# Patient Record
Sex: Female | Born: 1935 | Race: White | Hispanic: No | Marital: Married | Smoking: Never smoker
Health system: Southern US, Community
[De-identification: ages and names within clinical notes are randomized; demographics above are authoritative.]

## PROBLEM LIST (undated history)

## (undated) DIAGNOSIS — C801 Malignant (primary) neoplasm, unspecified: Secondary | ICD-10-CM

## (undated) DIAGNOSIS — I10 Essential (primary) hypertension: Secondary | ICD-10-CM

## (undated) DIAGNOSIS — M810 Age-related osteoporosis without current pathological fracture: Secondary | ICD-10-CM

## (undated) HISTORY — PX: ANTERIOR AND POSTERIOR VAGINAL REPAIR: SUR5

## (undated) HISTORY — PX: ABDOMINAL HYSTERECTOMY: SHX81

## (undated) HISTORY — PX: APPENDECTOMY: SHX54

## (undated) HISTORY — PX: TONSILLECTOMY: SUR1361

---

## 2012-07-08 ENCOUNTER — Other Ambulatory Visit: Payer: Self-pay | Admitting: Rheumatology

## 2012-07-08 LAB — SYNOVIAL CELL COUNT + DIFF, W/ CRYSTALS
Basophil: 0 %
Eosinophil: 0 %
Lymphocytes: 32 %
Other Cells BF: 0 %
Other Mononuclear Cells: 62 %

## 2012-10-23 ENCOUNTER — Ambulatory Visit: Payer: Self-pay | Admitting: Nurse Practitioner

## 2013-01-19 ENCOUNTER — Ambulatory Visit: Payer: Self-pay | Admitting: Ophthalmology

## 2013-01-19 DIAGNOSIS — Z0000 Encounter for general adult medical examination without abnormal findings: Secondary | ICD-10-CM

## 2013-01-26 ENCOUNTER — Ambulatory Visit: Payer: Self-pay | Admitting: Ophthalmology

## 2013-12-02 ENCOUNTER — Ambulatory Visit: Payer: Self-pay | Admitting: Nurse Practitioner

## 2014-08-20 NOTE — Op Note (Signed)
PATIENT NAME:  Monica Holder, Monica Holder MR#:  [id number] DATE OF BIRTH:  05-27-1930  DATE OF PROCEDURE:  01/26/2013  PREOPERATIVE DIAGNOSIS:  Cataract, right eye.   POSTOPERATIVE DIAGNOSIS:  Cataract, right eye.  PROCEDURE PERFORMED:  Extracapsular cataract extraction using phacoemulsification with placement of an Alcon [id number], 24.0-diopter posterior chamber lens, serial [id number].  SURGEON:  Loura Back. Whitman Meinhardt, MD  ASSISTANT:  None.  ANESTHESIA:  4% lidocaine and 0.75% Marcaine in a 50/50 mixture with 10 units/mL of Hylenex added, given as a peribulbar.  ANESTHESIOLOGIST:  Velora Heckler. Polin, MD  COMPLICATIONS:  None.  ESTIMATED BLOOD LOSS:  Less than 1 ml.  DESCRIPTION OF PROCEDURE:  The patient was brought to the operating room and given a peribulbar block.  The patient was then prepped and draped in the usual fashion.  The vertical rectus muscles were imbricated using 5-0 silk sutures.  These sutures were then clamped to the sterile drapes as bridle sutures.  A limbal peritomy was performed extending two clock hours and hemostasis was obtained with cautery.  A partial thickness scleral groove was made at the surgical limbus and dissected anteriorly in a lamellar dissection using an Alcon crescent knife.  The anterior chamber was entered superonasally with a Superblade and through the lamellar dissection with a 2.6 mm keratome.  DisCoVisc was used to replace the aqueous and a continuous tear capsulorrhexis was carried out.  Hydrodissection and hydrodelineation were carried out with balanced salt and a 27 gauge canula.  The nucleus was rotated to confirm the effectiveness of the hydrodissection.  Phacoemulsification was carried out using a divide-and-conquer technique.  Total ultrasound time was 1 minute and 26 seconds with an average power of 24.7, CDE of 35.91.   Irrigation/aspiration was used to remove the residual cortex.  DisCoVisc was used to inflate the capsule and the internal incision  was enlarged to 3 mm with the crescent knife.  The intraocular lens was folded and inserted into the capsular bag using the AcrySert delivery system.  Irrigation/aspiration was used to remove the residual DisCoVisc.  Miostat was injected into the anterior chamber through the paracentesis track to inflate the anterior chamber and induce miosis. 0.1 mL of cefuroxime delivering 1 mg of drug was placed in the anterior chamber via the paracentesis track. The wound was checked for leaks and none were found. The conjunctiva was closed with cautery and the bridle sutures were removed.  Two drops of 0.3% Vigamox were placed on the eye.   An eye shield was placed on the eye.  The patient was discharged to the recovery room in good condition.   ____________________________ Loura Back Jaion Lagrange, MD sad:jm D: 01/26/2013 13:20:16 ET T: 01/26/2013 14:44:58 ET JOB#: [id number]  cc: Remo Lipps A. Bladyn Tipps, MD, <Dictator> Martie Lee MD ELECTRONICALLY SIGNED 02/02/2013 13:24

## 2016-02-16 ENCOUNTER — Other Ambulatory Visit: Payer: Self-pay | Admitting: Nurse Practitioner

## 2016-02-16 DIAGNOSIS — Z1231 Encounter for screening mammogram for malignant neoplasm of breast: Secondary | ICD-10-CM

## 2016-02-22 ENCOUNTER — Other Ambulatory Visit: Payer: Self-pay | Admitting: Nurse Practitioner

## 2016-02-22 ENCOUNTER — Ambulatory Visit
Admission: RE | Admit: 2016-02-22 | Discharge: 2016-02-22 | Disposition: A | Discharge status: Home / Self Care | Payer: Medicare Other | Source: Ambulatory Visit | Attending: Nurse Practitioner | Admitting: Nurse Practitioner

## 2016-02-22 DIAGNOSIS — Z1231 Encounter for screening mammogram for malignant neoplasm of breast: Secondary | ICD-10-CM | POA: Insufficient documentation

## 2016-02-22 HISTORY — DX: Malignant (primary) neoplasm, unspecified: C80.1

## 2016-04-12 ENCOUNTER — Ambulatory Visit
Admission: EM | Admit: 2016-04-12 | Discharge: 2016-04-12 | Disposition: A | Discharge status: Home / Self Care | Payer: Medicare Other | Attending: Emergency Medicine | Admitting: Emergency Medicine

## 2016-04-12 ENCOUNTER — Encounter: Payer: Self-pay | Admitting: *Deleted

## 2016-04-12 DIAGNOSIS — J069 Acute upper respiratory infection, unspecified: Secondary | ICD-10-CM | POA: Diagnosis not present

## 2016-04-12 HISTORY — DX: Essential (primary) hypertension: I10

## 2016-04-12 HISTORY — DX: Age-related osteoporosis without current pathological fracture: M81.0

## 2016-04-12 MED ORDER — MOMETASONE FUROATE 50 MCG/ACT NA SUSP: 2.0000 | Freq: Every day | NASAL | 0 refills | Status: AC

## 2016-04-12 MED ORDER — BENZONATATE 200 MG PO CAPS: 200.0000 mg | ORAL_CAPSULE | Freq: Three times a day (TID) | ORAL | 0 refills | Status: AC | PRN

## 2016-04-12 MED ORDER — AMOXICILLIN-POT CLAVULANATE 875-125 MG PO TABS: 1.0000 | ORAL_TABLET | Freq: Two times a day (BID) | ORAL | 0 refills | Status: AC

## 2016-04-12 NOTE — Discharge Instructions (Signed)
Take the medication as written. Finish the  antibiotics. You may take 1 gram of tylenol up to 3 times a day as needed for pain.  Use a neti pot or the NeilMed sinus rinse as often as you want to to reduce nasal congestion. Follow the directions on the box.   Go to www.goodrx.com to look up your medications. This will give you a list of where you can find your prescriptions at the most affordable prices.

## 2016-04-12 NOTE — ED Triage Notes (Addendum)
Patient started having sore throat, nasal congestion, and cough 6 days ago. PAtient reports taking some left over amoxicillin that she had froze when she didn't finish the course 2 years ago. Patient reports improvement, but she ran out of amox.

## 2016-04-12 NOTE — ED Provider Notes (Signed)
HPI  SUBJECTIVE:  Monica Holder is a 82 y.o. female who presents with cough productive of clear phlegm, chest soreness, nasal congestion, rhinorrhea, postnasal drip, scratchy, sore throat, body aches, malaise, sinus pain and pressure for the past 6 days. She has been taking amoxicillin 500 mg twice a day for the past 6 days and Sudafed. She has run out of amoxicillin. She states that the amoxicillin and Sudafed help her symptoms. Symptoms are worse with going out into the cold air. She denies ear pain, she is sleeping okay at night. No dental pain, purulent nasal drainage. No antipyretic in the past 6-8 hours. She has a past medical history of acid process, osteoarthritis, no history of diabetes, hypertension, asthma, emphysema, COPD. She does not smoke. PMD: Sallee Lange, NP    Past Medical History:  Diagnosis Date  . Cancer ([place])    cervical ca  . Hypertension   . Osteoporosis     Past Surgical History:  Procedure Laterality Date  . ABDOMINAL HYSTERECTOMY    . ANTERIOR AND POSTERIOR VAGINAL REPAIR    . APPENDECTOMY    . TONSILLECTOMY      Family History  Problem Relation Age of Onset  . Breast cancer Mother 90    Social History  Substance Use Topics  . Smoking status: Never Smoker  . Smokeless tobacco: Never Used  . Alcohol use No    No current facility-administered medications for this encounter.   Current Outpatient Prescriptions:  .  amLODipine (NORVASC) 5 MG tablet, Take 5 mg by mouth daily., Disp: , Rfl:  .  denosumab (PROLIA) 60 MG/ML SOLN injection, Inject 60 mg into the skin every 6 (six) months. Administer in upper arm, thigh, or abdomen, Disp: , Rfl:   Allergies  Allergen Reactions  . Coconut Oil Other (See Comments)    Fever blisters on lips.     ROS  As noted in HPI.   Physical Exam  BP (!) 155/75 (BP Location: Right Arm)   Pulse 100   Temp 98 F (36.7 C) (Oral)   Resp 16   Ht 5\' 1"  (1.549 m)   Wt 128 lb (58.1 kg)   SpO2 98%   BMI  24.19 kg/m   Constitutional: Well developed, well nourished, no acute distress Eyes:  EOMI, conjunctiva normal bilaterally HENT: Normocephalic, atraumatic,mucus membranes moist. Positive mucopurulent nasal congestion, minimal sinus tenderness. Normal oropharynx with cobblestoning and postnasal drip  Respiratory: Normal inspiratory effort lungs clear bilaterally, good air movement Cardiovascular: Normal rate regular rhythm no murmurs rubs or gallops GI: nondistended skin: No rash, skin intact Musculoskeletal: no deformities Neurologic: Alert & oriented x 3, no focal neuro deficits Psychiatric: Speech and behavior appropriate   ED Course   Medications - No data to display  No orders of the defined types were placed in this encounter.   No results found for this or any previous visit (from the past 24 hour(s)). No results found.  ED Clinical Impression  Upper respiratory tract infection, unspecified type   ED Assessment/Plan  Presentation most consistent with a URI/early sinusitis, patient has partially treated herself of the amoxicillin. We'll send home with Augmentin for 5 days, Nasonex, Tessalon. She is to continue Sudafed, neti pot, Tylenol.  she'll follow-up with her primary care physician as needed. Discussed signs and symptoms that should prompt return to the emergency department. Patient agrees with plan  Meds ordered this encounter  Medications  . denosumab (PROLIA) 60 MG/ML SOLN injection    Sig:  Inject 60 mg into the skin every 6 (six) months. Administer in upper arm, thigh, or abdomen  . amLODipine (NORVASC) 5 MG tablet    Sig: Take 5 mg by mouth daily.    *This clinic note was created using Dragon dictation software. Therefore, there may be occasional mistakes despite careful proofreading.  ?   Melynda Ripple, MD 04/12/16 [phone]

## 2019-03-17 ENCOUNTER — Other Ambulatory Visit: Payer: Self-pay | Admitting: Neurology

## 2019-03-17 DIAGNOSIS — R413 Other amnesia: Secondary | ICD-10-CM

## 2019-03-29 ENCOUNTER — Ambulatory Visit
Admission: RE | Admit: 2019-03-29 | Discharge: 2019-03-29 | Disposition: A | Discharge status: Home / Self Care | Payer: Medicare Other | Source: Ambulatory Visit | Attending: Neurology | Admitting: Neurology

## 2019-03-29 ENCOUNTER — Other Ambulatory Visit: Payer: Self-pay

## 2019-03-29 DIAGNOSIS — R413 Other amnesia: Secondary | ICD-10-CM | POA: Diagnosis not present

## 2020-02-17 IMAGING — MR MR HEAD W/O CM
12 series · 43 of 48 positions shown · non-contrast
Comparison: None.

CLINICAL DATA: Memory loss/increasing forgetfulness.

EXAM:
MRI HEAD WITHOUT CONTRAST
TECHNIQUE: Multiplanar, multiecho pulse sequences of the brain and surrounding
structures were obtained without intravenous contrast.

[Series 5: ax dwi_tracew · axial · 3.0mm · 0.60mm/px · z∈[-43,+104]mm · 5 of 48 slices shown]
[im 1/48]
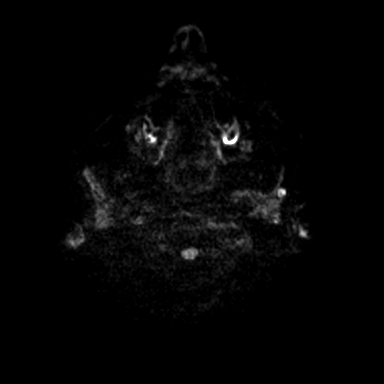
[im 12/48]
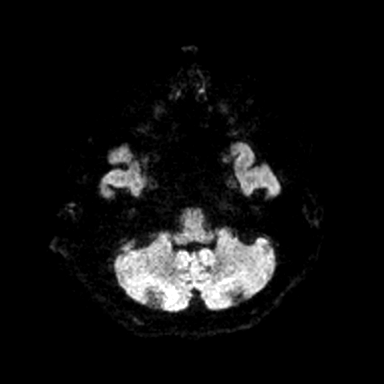
[im 24/48]
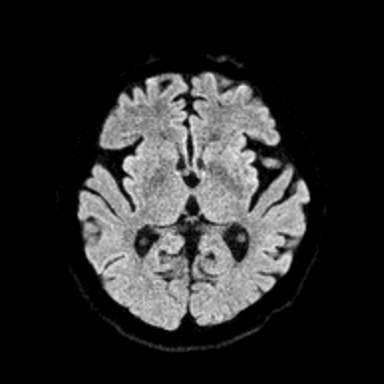
[im 36/48]
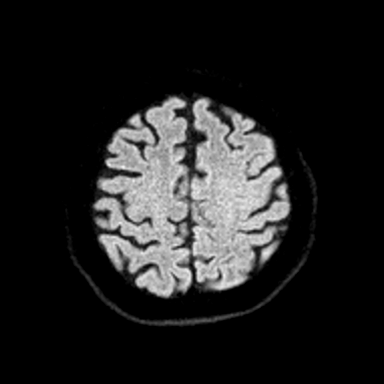
[im 48/48]
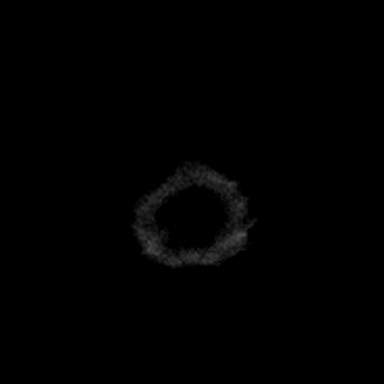

[Series 6: ax dwi_adc · axial · 3.0mm · 0.60mm/px · z∈[-43,+104]mm · 4 of 48 slices shown]
[im 1/48]
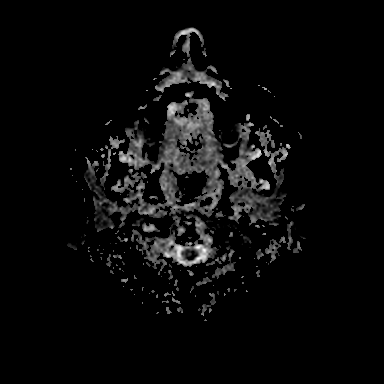
[im 16/48]
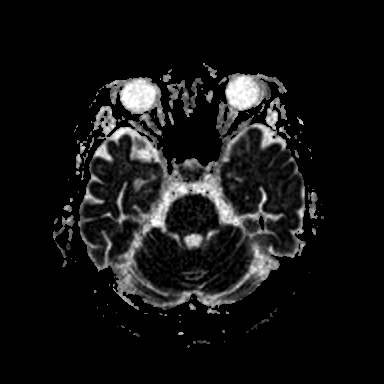
[im 32/48]
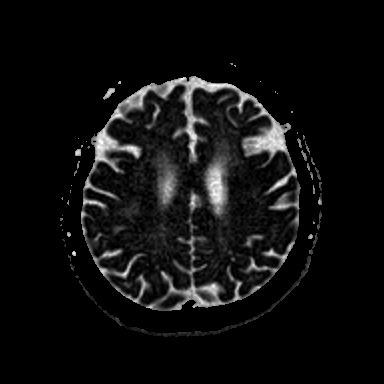
[im 48/48]
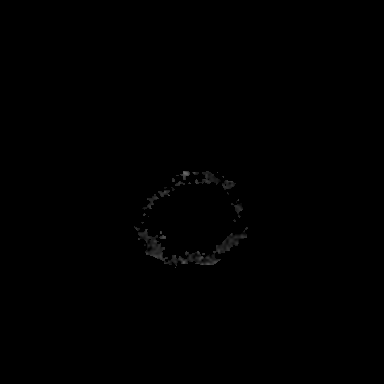

[Series 7: T1 · sagittal · 5.0mm · 0.60mm/px · 2 of 24 slices shown (1 of 2)]
[im 1/24]
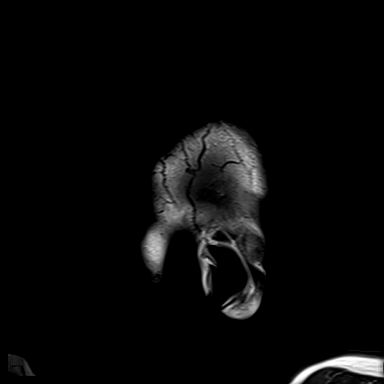
[im 24/24]
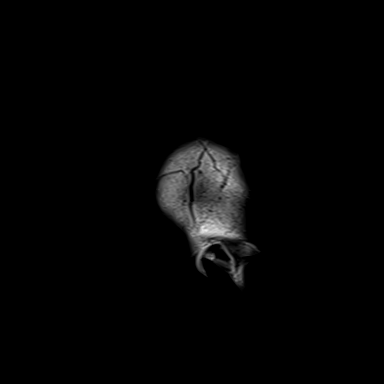

[Series 8: cor dwi_tracew · coronal · 5.0mm · 0.60mm/px · 3 of 38 slices shown]
[im 1/38]
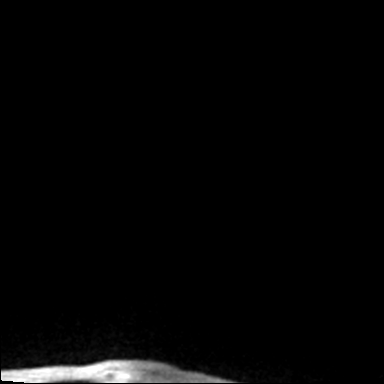
[im 19/38]
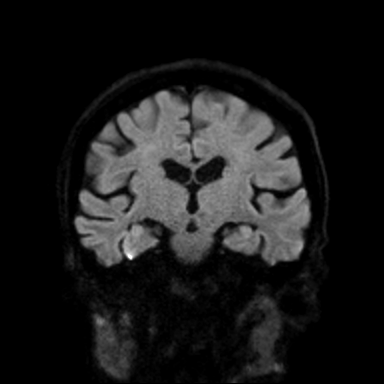
[im 38/38]
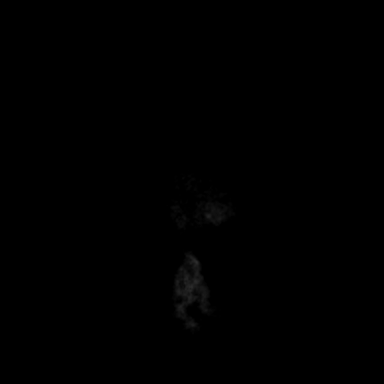

[Series 9: cor dwi_adc · coronal · 5.0mm · 0.60mm/px · 3 of 37 slices shown]
[im 1/37]
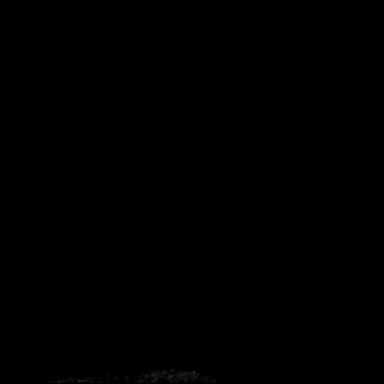
[im 19/37]
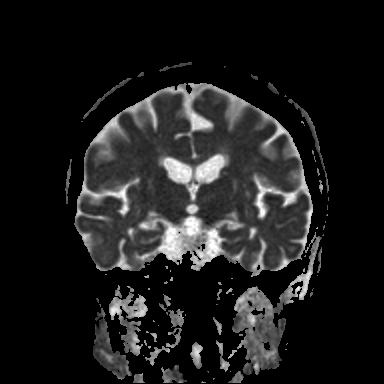
[im 37/37]
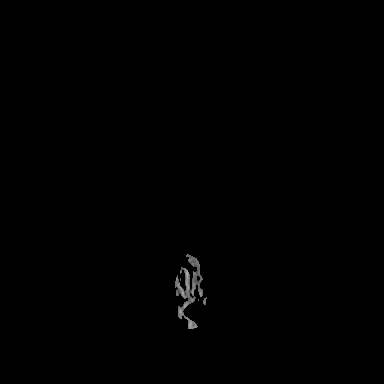

[Series 10: T2 · axial · 5.0mm · 0.53mm/px · z∈[-38,+99]mm · 2 of 25 slices shown (1 of 2)]
[im 1/25]
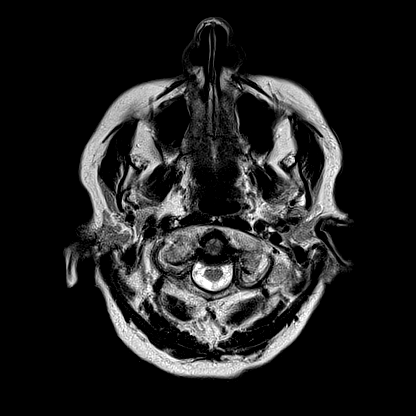
[im 25/25]
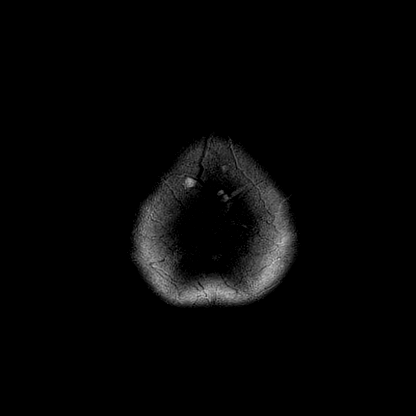

[Series 11: mag_images · axial · 4.0mm · 0.90mm/px · z∈[-42,+106]mm · 3 of 40 slices shown]
[im 1/40]
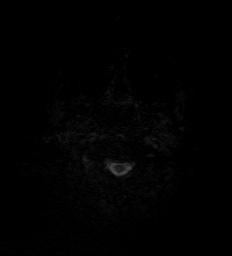
[im 20/40]
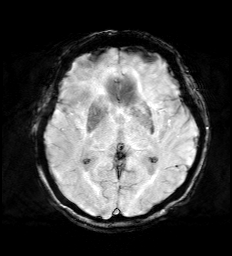
[im 40/40]
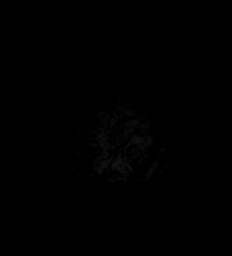

[Series 12: pha_images · axial · 4.0mm · 0.90mm/px · z∈[-42,+106]mm · 3 of 40 slices shown]
[im 1/40]
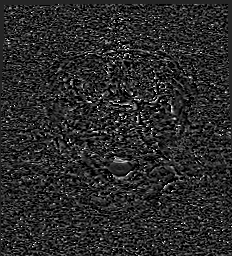
[im 20/40]
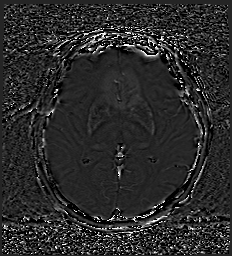
[im 40/40]
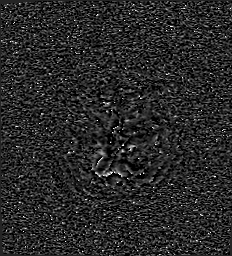

[Series 13: swi_images · axial · 4.0mm · 0.90mm/px · z∈[-42,+106]mm · 3 of 40 slices shown]
[im 1/40]
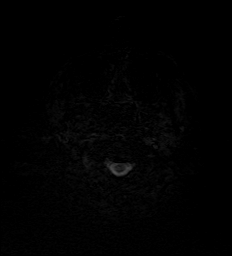
[im 20/40]
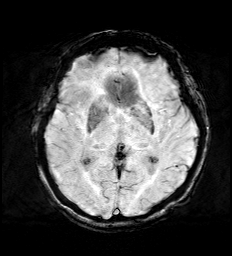
[im 40/40]
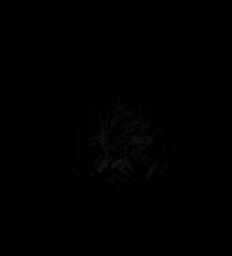

[Series 15: FLAIR · axial · 3.0mm · 0.53mm/px · z∈[-47,+107]mm · 4 of 55 slices shown]
[im 1/55]
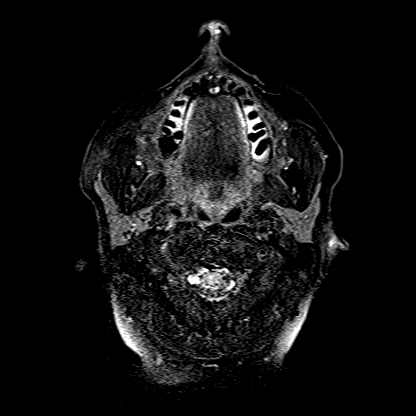
[im 19/55]
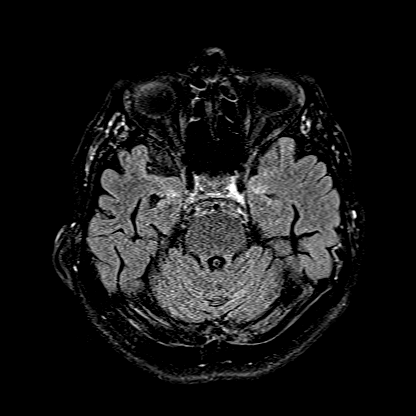
[im 37/55]
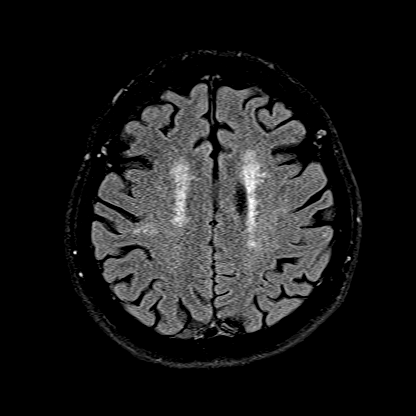
[im 55/55]
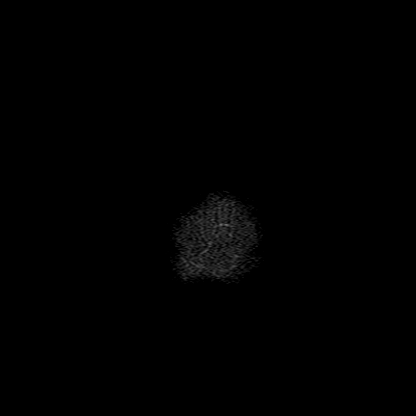

[Series 16: T1 · axial · 1.0mm · 0.98mm/px · z∈[-52,+116]mm · 9 of 176 slices shown (2 of 2)]
[im 1/176]
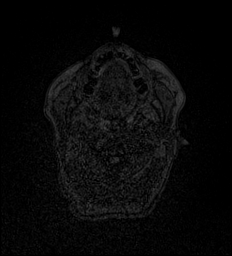
[im 14/176]
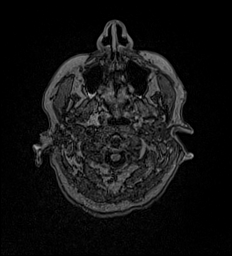
[im 27/176]
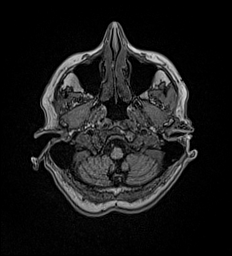
[im 54/176]
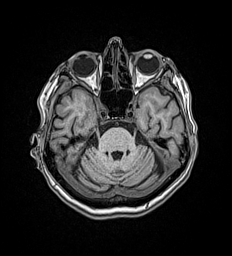
[im 81/176]
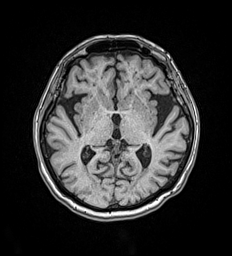
[im 95/176]
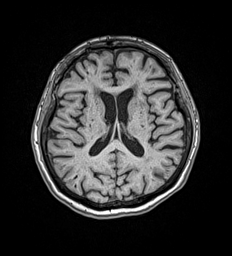
[im 122/176]
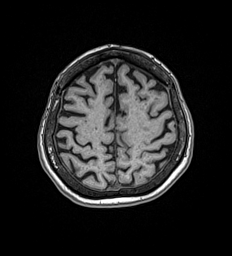
[im 149/176]
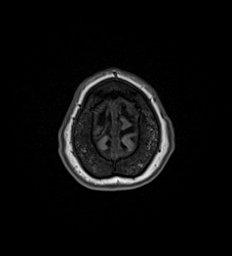
[im 176/176]
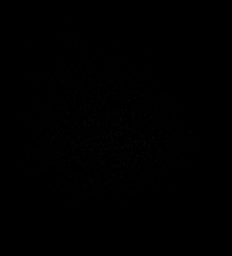

[Series 17: T2 · coronal · 5.0mm · 0.57mm/px · 2 of 29 slices shown (2 of 2)]
[im 1/29]
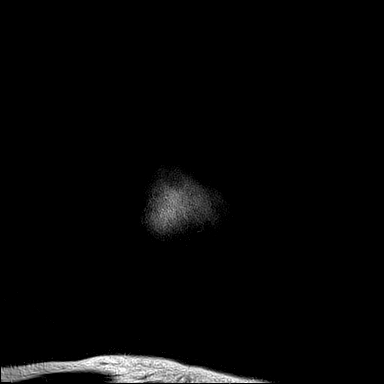
[im 29/29]
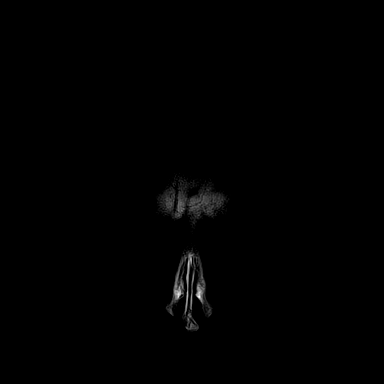

[43 of 48 positions shown; findings below may reference images not displayed]

FINDINGS: Brain: There is no evidence of acute infarct, intracranial
hemorrhage, mass, midline shift, or extra-axial fluid collection.
Patchy T2 hyperintensities in the cerebral white matter bilaterally
are nonspecific but compatible with mild-to-moderate chronic small
vessel ischemic disease. Cerebral atrophy is without lobar
predominance and does not appear significantly advanced for age.

Vascular: Major intracranial vascular flow voids are preserved.

Skull and upper cervical spine: Unremarkable bone marrow signal.

Sinuses/Orbits: Right cataract extraction. Focal mucosal thickening
or small mucous retention cyst in the left frontal sinus. Clear
mastoid air cells.

Other: None.
IMPRESSION: 1. No acute intracranial abnormality.
2. Mild-to-moderate chronic small vessel ischemic disease.

## 2023-04-30 ENCOUNTER — Emergency Department: Payer: Medicare Other

## 2023-04-30 ENCOUNTER — Encounter: Payer: Self-pay | Admitting: Emergency Medicine

## 2023-04-30 ENCOUNTER — Emergency Department
Admission: EM | Admit: 2023-04-30 | Discharge: 2023-04-30 | Disposition: A | Discharge status: Home / Self Care | Payer: Medicare Other | Attending: Emergency Medicine | Admitting: Emergency Medicine

## 2023-04-30 ENCOUNTER — Other Ambulatory Visit: Payer: Self-pay

## 2023-04-30 DIAGNOSIS — N390 Urinary tract infection, site not specified: Secondary | ICD-10-CM

## 2023-04-30 DIAGNOSIS — R4182 Altered mental status, unspecified: Secondary | ICD-10-CM | POA: Diagnosis present

## 2023-04-30 DIAGNOSIS — F0390 Unspecified dementia without behavioral disturbance: Secondary | ICD-10-CM | POA: Diagnosis not present

## 2023-04-30 LAB — URINALYSIS, ROUTINE W REFLEX MICROSCOPIC
Bacteria, UA: NONE SEEN
Bilirubin Urine: NEGATIVE
Glucose, UA: NEGATIVE mg/dL
Hgb urine dipstick: NEGATIVE
Ketones, ur: 5 mg/dL — AB
Nitrite: NEGATIVE
Protein, ur: 30 mg/dL — AB
Specific Gravity, Urine: 1.025 (ref 1.005–1.030)
WBC, UA: >50 WBC/hpf (ref 0–5)
pH: 5 (ref 5.0–8.0)

## 2023-04-30 LAB — CBC WITH DIFFERENTIAL/PLATELET
Abs Immature Granulocytes: 0.02 10*3/uL (ref 0.00–0.07)
Basophils Absolute: 0 10*3/uL (ref 0.0–0.1)
Basophils Relative: 1 %
Eosinophils Absolute: 0.1 10*3/uL (ref 0.0–0.5)
Eosinophils Relative: 1 %
HCT: 45.3 % (ref 36.0–46.0)
Hemoglobin: 14.6 g/dL (ref 12.0–15.0)
Immature Granulocytes: 0 %
Lymphocytes Relative: 33 %
Lymphs Abs: 1.9 10*3/uL (ref 0.7–4.0)
MCH: 29.7 pg (ref 26.0–34.0)
MCHC: 32.2 g/dL (ref 30.0–36.0)
MCV: 92.1 fL (ref 80.0–100.0)
Monocytes Absolute: 0.4 10*3/uL (ref 0.1–1.0)
Monocytes Relative: 7 %
Neutro Abs: 3.3 10*3/uL (ref 1.7–7.7)
Neutrophils Relative %: 58 %
Platelets: 142 10*3/uL — ABNORMAL LOW (ref 150–400)
RBC: 4.92 MIL/uL (ref 3.87–5.11)
RDW: 13 % (ref 11.5–15.5)
WBC: 5.6 10*3/uL (ref 4.0–10.5)
nRBC: 0 % (ref 0.0–0.2)

## 2023-04-30 LAB — BASIC METABOLIC PANEL
Anion gap: 11 (ref 5–15)
BUN: 15 mg/dL (ref 8–23)
CO2: 21 mmol/L — ABNORMAL LOW (ref 22–32)
Calcium: 8.8 mg/dL — ABNORMAL LOW (ref 8.9–10.3)
Chloride: 108 mmol/L (ref 98–111)
Creatinine, Ser: 0.87 mg/dL (ref 0.44–1.00)
GFR, Estimated: >60 mL/min (ref >60)
Glucose, Bld: 81 mg/dL (ref 70–99)
Potassium: 3.8 mmol/L (ref 3.5–5.1)
Sodium: 140 mmol/L (ref 135–145)

## 2023-04-30 MED ORDER — CEPHALEXIN 500 MG PO CAPS: 500.0000 mg | ORAL_CAPSULE | Freq: Three times a day (TID) | ORAL | 0 refills | Status: AC

## 2023-04-30 MED ORDER — CEPHALEXIN 500 MG PO CAPS
500.0000 mg | ORAL_CAPSULE | Freq: Once | ORAL | Status: AC
  Administered 2023-04-30: 500 mg via ORAL
  Filled 2023-04-30: qty 1

## 2023-04-30 NOTE — ED Provider Notes (Signed)
 [facility] Provider Note    Event Date/Time   First MD Initiated Contact with Patient 04/30/23 2058     (approximate)   History   Altered Mental Status and Headache  History limited by dementia HPI  Monica Holder is a 89 y.o. female with a history of dementia sent in for complaints of headache and possible altered mental status.  Daughter reports the patient is at her baseline.     Physical Exam   Triage Vital Signs: ED Triage Vitals  Encounter Vitals Group     BP 04/30/23 1215 (!) 159/86     Systolic BP Percentile --      Diastolic BP Percentile --      Pulse Rate 04/30/23 1215 76     Resp 04/30/23 1215 16     Temp 04/30/23 1215 98.6 F (37 C)     Temp Source 04/30/23 1215 Oral     SpO2 04/30/23 1215 98 %     Weight --      Height --      Head Circumference --      Peak Flow --      Pain Score 04/30/23 1216 0     Pain Loc --      Pain Education --      Exclude from Growth Chart --     Most recent vital signs: Vitals:   04/30/23 2109 04/30/23 2130  BP: (!) 179/64 (!) 151/75  Pulse: 79 82  Resp: 18 18  Temp: (!) 97.4 F (36.3 C)   SpO2: 99% 99%     General: Awake, no distress.  Pleasant interactive, smiling CV:  Good peripheral perfusion.  Resp:  Normal effort.  Abd:  No distention.  Other:  Normal neurologic exam, no facial droop   ED Results / Procedures / Treatments   Labs (all labs ordered are listed, but only abnormal results are displayed) Labs Reviewed  CBC WITH DIFFERENTIAL/PLATELET - Abnormal; Notable for the following components:      Result Value   Platelets 142 (*)    All other components within normal limits  BASIC METABOLIC PANEL - Abnormal; Notable for the following components:   CO2 21 (*)    Calcium 8.8 (*)    All other components within normal limits  URINALYSIS, ROUTINE W REFLEX MICROSCOPIC - Abnormal; Notable for the following components:   Color, Urine YELLOW (*)    APPearance CLOUDY (*)     Ketones, ur 5 (*)    Protein, ur 30 (*)    Leukocytes,Ua LARGE (*)    All other components within normal limits     EKG  ED ECG REPORT I, Lamar Price, the attending physician, personally viewed and interpreted this ECG.  Date: 04/30/2023  Rhythm: normal sinus rhythm QRS Axis: normal Intervals: normal ST/T Wave abnormalities: normal Narrative Interpretation: no evidence of acute ischemia    RADIOLOGY CT head viewed interpreted me, no bleeding noted, confirmed by radiology    PROCEDURES:  Critical Care performed:   Procedures   MEDICATIONS ORDERED IN ED: Medications  cephALEXin  (KEFLEX ) capsule 500 mg (500 mg Oral Given 04/30/23 2145)     IMPRESSION / MDM / ASSESSMENT AND PLAN / ED COURSE  I reviewed the triage vital signs and the nursing notes. Patient's presentation is most consistent with acute presentation with potential threat to life or bodily function.  Patient sent for evaluation for altered mental status, she appears to be at her baseline now per  her daughter.  Differential includes TIA, CVA, electrolyte abnormality, UTI  Lab work overall quite reassuring, urinalysis is consistent with urinary tract infection, will treat with Keflex  p.o., daughter does not want further extensive workup given patient's history of dementia and DNR  Appropriate for discharge at this time        FINAL CLINICAL IMPRESSION(S) / ED DIAGNOSES   Final diagnoses:  Lower urinary tract infectious disease  Altered mental status, unspecified altered mental status type     Rx / DC Orders   ED Discharge Orders          Ordered    cephALEXin  (KEFLEX ) 500 MG capsule  3 times daily        04/30/23 2138             Note:  This document was prepared using Dragon voice recognition software and may include unintentional dictation errors.   Arlander Charleston, MD 04/30/23 2150

## 2023-04-30 NOTE — ED Triage Notes (Signed)
 Pt from [facility] via [facility]. Staff reported pt had severe HA and was not responding to name. LKW 0830 today. Staff states she had right facial droop and slurred speech. EMS states symptoms resolved upon their arrival. Pt currently denies HA and does not present with facial droop. Daughter in triage. States pt has dementia and has confusion/agitation at times.  EMS VSS. CBG 99.

## 2023-04-30 NOTE — ED Provider Triage Note (Signed)
 Emergency Medicine Provider Triage Evaluation Note  Monica Holder , a 89 y.o. female  was evaluated in triage.  Pt complains of altered mental status.  Brought in by EMS from nursing home, nursing home reported that she was complaining of headache and was acting confused.  According to EMS, by the time EMS arrived she had returned to baseline.  Daughter with her reports that she is currently at her baseline.  Review of Systems  Positive: AMS Negative: Cp/dizziness  Physical Exam  There were no vitals taken for this visit. Gen:   Awake, no distress   Resp:  Normal effort  MSK:   Moves extremities without difficulty  Other:  No facial droop, normal strength, equal bilaterally, normal speech  Medical Decision Making  Medically screening exam initiated at 12:12 PM.  Appropriate orders placed.  Monica Holder was informed that the remainder of the evaluation will be completed by another provider, this initial triage assessment does not replace that evaluation, and the importance of remaining in the ED until their evaluation is complete.     Ehsan Corvin E, PA-C 04/30/23 1214

## 2023-04-30 NOTE — ED Notes (Signed)
 Patient and family provided with discharge instructions including prescriptions x1 and importance of follow up appt as needed with stated understanding. INT removed, cannula intact, pressure dressing applied. Patient stable and wheeled out to car with daughter on dispo.

## 2023-12-23 ENCOUNTER — Emergency Department

## 2023-12-23 ENCOUNTER — Emergency Department
Admission: EM | Admit: 2023-12-23 | Discharge: 2023-12-23 | Disposition: A | Discharge status: Home / Self Care | Attending: Emergency Medicine | Admitting: Emergency Medicine

## 2023-12-23 DIAGNOSIS — F0390 Unspecified dementia without behavioral disturbance: Secondary | ICD-10-CM | POA: Diagnosis not present

## 2023-12-23 DIAGNOSIS — Y92129 Unspecified place in nursing home as the place of occurrence of the external cause: Secondary | ICD-10-CM | POA: Insufficient documentation

## 2023-12-23 DIAGNOSIS — S066XAA Traumatic subarachnoid hemorrhage with loss of consciousness status unknown, initial encounter: Secondary | ICD-10-CM

## 2023-12-23 DIAGNOSIS — W01198A Fall on same level from slipping, tripping and stumbling with subsequent striking against other object, initial encounter: Secondary | ICD-10-CM | POA: Insufficient documentation

## 2023-12-23 DIAGNOSIS — S0990XA Unspecified injury of head, initial encounter: Secondary | ICD-10-CM

## 2023-12-23 DIAGNOSIS — Z23 Encounter for immunization: Secondary | ICD-10-CM | POA: Diagnosis not present

## 2023-12-23 DIAGNOSIS — R110 Nausea: Secondary | ICD-10-CM

## 2023-12-23 DIAGNOSIS — S065XAA Traumatic subdural hemorrhage with loss of consciousness status unknown, initial encounter: Secondary | ICD-10-CM

## 2023-12-23 DIAGNOSIS — W19XXXA Unspecified fall, initial encounter: Secondary | ICD-10-CM

## 2023-12-23 DIAGNOSIS — S0001XA Abrasion of scalp, initial encounter: Secondary | ICD-10-CM | POA: Diagnosis not present

## 2023-12-23 LAB — URINALYSIS, W/ REFLEX TO CULTURE (INFECTION SUSPECTED)
Bilirubin Urine: NEGATIVE
Glucose, UA: NEGATIVE mg/dL
Hgb urine dipstick: NEGATIVE
Ketones, ur: NEGATIVE mg/dL
Leukocytes,Ua: NEGATIVE
Nitrite: NEGATIVE
Protein, ur: NEGATIVE mg/dL
Specific Gravity, Urine: 1.009 (ref 1.005–1.030)
pH: 7 (ref 5.0–8.0)

## 2023-12-23 LAB — CBC
HCT: 39.3 % (ref 36.0–46.0)
Hemoglobin: 13.1 g/dL (ref 12.0–15.0)
MCH: 30 pg (ref 26.0–34.0)
MCHC: 33.3 g/dL (ref 30.0–36.0)
MCV: 89.9 fL (ref 80.0–100.0)
Platelets: 137 K/uL — ABNORMAL LOW (ref 150–400)
RBC: 4.37 MIL/uL (ref 3.87–5.11)
RDW: 13.2 % (ref 11.5–15.5)
WBC: 5.7 K/uL (ref 4.0–10.5)
nRBC: 0 % (ref 0.0–0.2)

## 2023-12-23 LAB — BASIC METABOLIC PANEL WITH GFR
Anion gap: 10 (ref 5–15)
BUN: 10 mg/dL (ref 8–23)
CO2: 24 mmol/L (ref 22–32)
Calcium: 8.7 mg/dL — ABNORMAL LOW (ref 8.9–10.3)
Chloride: 109 mmol/L (ref 98–111)
Creatinine, Ser: 0.65 mg/dL (ref 0.44–1.00)
GFR, Estimated: >60 mL/min (ref >60)
Glucose, Bld: 105 mg/dL — ABNORMAL HIGH (ref 70–99)
Potassium: 3.8 mmol/L (ref 3.5–5.1)
Sodium: 143 mmol/L (ref 135–145)

## 2023-12-23 LAB — TROPONIN I (HIGH SENSITIVITY): Troponin I (High Sensitivity): 4 ng/L (ref <18)

## 2023-12-23 MED ORDER — ACETAMINOPHEN 500 MG PO TABS
1000.0000 mg | ORAL_TABLET | Freq: Once | ORAL | Status: AC
  Administered 2023-12-23: 1000 mg via ORAL
  Filled 2023-12-23: qty 2

## 2023-12-23 MED ORDER — TETANUS-DIPHTH-ACELL PERTUSSIS 5-2.5-18.5 LF-MCG/0.5 IM SUSY
0.5000 mL | PREFILLED_SYRINGE | Freq: Once | INTRAMUSCULAR | Status: AC
  Administered 2023-12-23: 0.5 mL via INTRAMUSCULAR
  Filled 2023-12-23: qty 0.5

## 2023-12-23 MED ORDER — ONDANSETRON 4 MG PO TBDP
4.0000 mg | ORAL_TABLET | Freq: Once | ORAL | Status: AC
  Administered 2023-12-23: 4 mg via ORAL
  Filled 2023-12-23: qty 1

## 2023-12-23 NOTE — ED Notes (Addendum)
 Pt going to [facility] with daughter, Reniyah Gootee.

## 2023-12-23 NOTE — ED Triage Notes (Signed)
 Pt presents to the ED via [facility] from [facility] side. Pt had a mechanical fall. Small laceration on posterior scalp. Pt does not take blood thinners. No known LOC. Pt A&Ox4. Does have a hx of alzhemiers. Pt does reports some nausea.   146/77 HR 82 97% RA 97.0 axillary temp

## 2023-12-23 NOTE — ED Notes (Signed)
 Pt returned from CT

## 2023-12-23 NOTE — ED Notes (Signed)
Assisted patient with use of bedpan.

## 2023-12-23 NOTE — ED Provider Notes (Signed)
  Physical Exam  BP (!) 145/70   Pulse 81   Temp 97.9 F (36.6 C) (Oral)   Resp 18   Ht 5' 1 (1.549 m)   SpO2 99%   BMI 24.19 kg/m   Physical Exam  Procedures  Procedures  ED Course / MDM   Clinical Course as of 12/23/23 2052  Mon Dec 23, 2023  1507 S/o from Dr. Suzanne: - 72F w/ subdural hematoma after fall, not on thinners - NSGY recs 6h rpt CT --> if stable, rec DC home w/ outpt f/u, no antiepileptics  TO DO: - f/u rpt CT  [MM]  1533 Neurosurgery with Dr. Claudene recommended CT scan repeat at 6 hours, no Keppra and if CT scan is stable can discharge back to her nursing facility. [SM]  1933 CTH: IMPRESSION: 1. Unchanged small acute subdural hematoma over the left cerebral convexity and suspected trace subdural hematoma over the right cerebral convexity. No mass effect. 2. Unchanged minimal subarachnoid hemorrhage at the right temporoparietal junction.   [MM]  2025 Patient and family bedside updated including granddaughter and daughter who is POA. Discussed NSGY recommendation for DC home if [facility] is stable 6 hrs from initial, which it fortunately is. No recommendation for anti-epileptics.   Family states pt is on aspirin, counseled to hold until cleared by her primary and/or nsgy.   Pt does complain of some dizziness when sitting up. Counseled that this may improve or could be new baseline from intracranial hemorrhage.  Did discuss possible admission for this versus discharge back to patient's facility, is already on a memory care unit with 24/7 care --explained that we sometimes do this for PT OT and placement though patient already seems to have appropriate resources in her current living situation.  Family agrees and is amenable to discharge back to her facility, understands that she may have some ongoing dizziness.  On review of labs, appears urinalysis is not yet resulted.  Spoke with lab, is in lab but they did not run it but will run it now [MM]  2051 Urinalysis with  no evidence of infection [MM]    Clinical Course User Index [MM] [name] LABOR, MD [SM] Suzanne Kirsch, MD   Medical Decision Making Amount and/or Complexity of Data Reviewed Labs: ordered. Radiology: ordered.  Risk OTC drugs. Prescription drug management.          [name] LABOR, MD 12/23/23 2052

## 2023-12-23 NOTE — ED Provider Notes (Addendum)
 [facility] Provider Note    Event Date/Time   First MD Initiated Contact with Patient 12/23/23 1135     (approximate)   History   Fall   HPI  Monica Holder is a 89 y.o. female past medical history significant for dementia who presents to the emergency department following a fall.  Fall was witnessed at nursing facility and they stated that she hit her head.  Did not believe that she lost consciousness.  Patient does states she does not feel well and feels nauseous.  Not on anticoagulation.  Denies chest pain, shortness of breath, abdominal pain, back pain, pain in her legs or hips.  Denies any burning with urination but states that she does not need to urinate.  Uncertain of last tetanus shot.     Physical Exam   Triage Vital Signs: ED Triage Vitals  Encounter Vitals Group     BP      Girls Systolic BP Percentile      Girls Diastolic BP Percentile      Boys Systolic BP Percentile      Boys Diastolic BP Percentile      Pulse      Resp      Temp      Temp src      SpO2      Weight      Height      Head Circumference      Peak Flow      Pain Score      Pain Loc      Pain Education      Exclude from Growth Chart     Most recent vital signs: Vitals:   12/23/23 1245 12/23/23 1430  BP: (!) 164/66   Pulse: 82 83  Resp: 18   Temp:    SpO2: 100% 100%    Physical Exam HENT:     Head:     Comments: Dried blood to the scalp.  Superficial abrasion to the occipital scalp with no large laceration and no active bleeding.    Right Ear: External ear normal.     Left Ear: External ear normal.  Eyes:     Extraocular Movements: Extraocular movements intact.     Pupils: Pupils are equal, round, and reactive to light.  Cardiovascular:     Rate and Rhythm: Normal rate.     Pulses: Normal pulses.  Pulmonary:     Effort: No respiratory distress.  Abdominal:     General: There is no distension.     Tenderness: There is no abdominal tenderness.   Musculoskeletal:        General: No tenderness. Normal range of motion.     Cervical back: No tenderness.     Comments: No tenderness bilateral hips.  No tenderness to palpation to bilateral upper or lower extremities.  No midline thoracic or lumbar tenderness to palpation.  No pain with range of motion to both legs.  Skin:    General: Skin is warm.     Capillary Refill: Capillary refill takes less than 2 seconds.  Neurological:     General: No focal deficit present.     Mental Status: She is alert.  Psychiatric:        Mood and Affect: Mood normal.     IMPRESSION / MDM / ASSESSMENT AND PLAN / ED COURSE  I reviewed the triage vital signs and the nursing notes.  Differential diagnosis including intracranial hemorrhage, concussion, electrolyte abnormality, dehydration,  dysrhythmia, anemia  Given ODT Zofran , Tylenol  and tetanus was updated  Low suspicion for basilar skull fracture.  No midline cervical spine tenderness  Plan for CT scan of the head and neck and chest x-ray.  EKG  I, Clotilda Punter, the attending physician, personally viewed and interpreted this ECG.  EKG showed sinus rhythm.  Prolonged PR interval of 262.  Significant artifact.  Nonspecific ST changes.  No significant change when compared to prior EKG.  Mild ST depression in the inferior leads when compared to prior.  No tachycardic or bradycardic dysrhythmias while on cardiac telemetry.  RADIOLOGY I independently reviewed imaging, my interpretation of imaging: CT scan of the head -subdural hematoma at 6 mm with no midline shift and subarachnoid hemorrhage  CT scan cervical spine -no fracture or dislocation.  Significant arthritis  Chest x-ray -no signs of pneumothorax or pneumonia.  Read as no acute findings.  LABS (all labs ordered are listed, but only abnormal results are displayed) Labs interpreted as -    Labs Reviewed  CBC - Abnormal; Notable for the following components:      Result Value    Platelets 137 (*)    All other components within normal limits  BASIC METABOLIC PANEL WITH GFR - Abnormal; Notable for the following components:   Glucose, Bld 105 (*)    Calcium 8.7 (*)    All other components within normal limits  URINALYSIS, W/ REFLEX TO CULTURE (INFECTION SUSPECTED)  TROPONIN I (HIGH SENSITIVITY)     MDM  Patient's lab work overall reassuring with no significant leukocytosis.  Troponin is negative and no active chest pain at this time.  No significant electrolyte abnormality.  Platelets chronically low at 137.  CT scan of the head with signs of subdural hematoma and subarachnoid hemorrhage.  Patient is at her mental status baseline.  Discussed the patient's case with neurosurgery on-call Dr. Claudene who recommended 6-hour repeat CT scan and if stable can discharge back to facility with no further workup.  Did not recommend starting on any AED.    Clinical Course as of 12/23/23 1602  Mon Dec 23, 2023  1507 S/o from Dr. Punter: - 58F w/ subdural hematoma after fall, not on thinners - NSGY recs 6h rpt CT --> if stable, rec DC home w/ outpt f/u, no antiepileptics  TO DO: - f/u rpt CT  [MM]  1533 Neurosurgery with Dr. Claudene recommended CT scan repeat at 6 hours, no Keppra and if CT scan is stable can discharge back to her nursing facility. [SM]    Clinical Course User Index [MM] Clarine Holder LABOR, MD [SM] Punter Clotilda, MD     PROCEDURES:  Critical Care performed: yes  .Critical Care  Performed by: Punter Clotilda, MD Authorized by: Punter Clotilda, MD   Critical care provider statement:    Critical care time (minutes):  35   Critical care time was exclusive of:  Separately billable procedures and treating other patients   Critical care was necessary to treat or prevent imminent or life-threatening deterioration of the following conditions:  CNS failure or compromise   Critical care was time spent personally by me on the following activities:  Development of  treatment plan with patient or surrogate, discussions with consultants, evaluation of patient's response to treatment, examination of patient, ordering and review of laboratory studies, ordering and review of radiographic studies, ordering and performing treatments and interventions, pulse oximetry, re-evaluation of patient's condition and review of old charts   Patient's presentation is  most consistent with acute presentation with potential threat to life or bodily function.   MEDICATIONS ORDERED IN ED: Medications  ondansetron  (ZOFRAN -ODT) disintegrating tablet 4 mg (4 mg Oral Given 12/23/23 1157)  acetaminophen  (TYLENOL ) tablet 1,000 mg (1,000 mg Oral Given 12/23/23 1207)  Tdap (BOOSTRIX) injection 0.5 mL (0.5 mLs Intramuscular Given 12/23/23 1209)    FINAL CLINICAL IMPRESSION(S) / ED DIAGNOSES   Final diagnoses:  Fall, initial encounter  Injury of head, initial encounter  Nausea     Rx / DC Orders   ED Discharge Orders     None        Note:  This document was prepared using Dragon voice recognition software and may include unintentional dictation errors.   Suzanne Kirsch, MD 12/23/23 1318    Suzanne Kirsch, MD 12/23/23 1319    Suzanne Kirsch, MD 12/23/23 [phone]

## 2023-12-23 NOTE — Discharge Instructions (Addendum)
 You were seen in the emergency department for head injury.  A CT scan of your head did not show intracranial bleeding, and the neurosurgeon was consulted who recommended discharge home as he was stable 6 hours later open-it fortunately was.  I placed a referral for you to follow-up with the neurosurgery team in clinic, and you should also follow-up with your primary care provider.  You had a chest x-ray that did not show any signs of pneumonia or broken bones.  Your lab work was overall normal.  Return to the emergency department for any worsening symptoms.  Can take Tylenol  as needed for headache.

## 2024-05-02 ENCOUNTER — Other Ambulatory Visit: Payer: Self-pay

## 2024-05-02 ENCOUNTER — Emergency Department

## 2024-05-02 ENCOUNTER — Emergency Department
Admission: EM | Admit: 2024-05-02 | Discharge: 2024-05-02 | Disposition: A | Discharge status: Skilled Nursing Facility | Attending: Emergency Medicine | Admitting: Emergency Medicine

## 2024-05-02 DIAGNOSIS — S0990XA Unspecified injury of head, initial encounter: Secondary | ICD-10-CM

## 2024-05-02 DIAGNOSIS — Z23 Encounter for immunization: Secondary | ICD-10-CM | POA: Diagnosis not present

## 2024-05-02 DIAGNOSIS — W01198A Fall on same level from slipping, tripping and stumbling with subsequent striking against other object, initial encounter: Secondary | ICD-10-CM | POA: Diagnosis not present

## 2024-05-02 DIAGNOSIS — Y92009 Unspecified place in unspecified non-institutional (private) residence as the place of occurrence of the external cause: Secondary | ICD-10-CM | POA: Insufficient documentation

## 2024-05-02 DIAGNOSIS — S0101XA Laceration without foreign body of scalp, initial encounter: Secondary | ICD-10-CM | POA: Diagnosis present

## 2024-05-02 DIAGNOSIS — I10 Essential (primary) hypertension: Secondary | ICD-10-CM | POA: Diagnosis not present

## 2024-05-02 DIAGNOSIS — W19XXXA Unspecified fall, initial encounter: Secondary | ICD-10-CM

## 2024-05-02 MED ORDER — TETANUS-DIPHTH-ACELL PERTUSSIS 5-2-15.5 LF-MCG/0.5 IM SUSP
0.5000 mL | Freq: Once | INTRAMUSCULAR | Status: AC
  Administered 2024-05-02: 0.5 mL via INTRAMUSCULAR
  Filled 2024-05-02: qty 0.5

## 2024-05-02 NOTE — ED Notes (Signed)
Pt placed on bedpan to urinate.

## 2024-05-02 NOTE — ED Notes (Signed)
Pt assisted to toilet 

## 2024-05-02 NOTE — ED Provider Notes (Signed)
 "  [facility] Provider Note   None    (approximate) History  Fall  HPI Monica Holder is a 89 y.o. female with stated past medical history of hypertension, osteoporosis, and hysterectomy who presents complaining of head trauma after a fall from standing at home.  Patient reportedly hit the side of an air conditioning greet and suffered a laceration to the right temple.  Patient states that the only thing she remembers was EMS standing over her when she woke up.  Patient currently denies any complaints. ROS: Patient currently denies any vision changes, tinnitus, difficulty speaking, facial droop, sore throat, chest pain, shortness of breath, abdominal pain, nausea/vomiting/diarrhea, dysuria, or weakness/numbness/paresthesias in any extremity   Physical Exam  Triage Vital Signs: ED Triage Vitals [05/02/24 0854]  Encounter Vitals Group     BP      Girls Systolic BP Percentile      Girls Diastolic BP Percentile      Boys Systolic BP Percentile      Boys Diastolic BP Percentile      Pulse      Resp      Temp      Temp src      SpO2      Weight 131 lb (59.4 kg)     Height      Head Circumference      Peak Flow      Pain Score      Pain Loc      Pain Education      Exclude from Growth Chart    Most recent vital signs: Vitals:   05/02/24 0854  BP: (!) 141/60  Pulse: 81  Resp: 18  Temp: 97.9 F (36.6 C)  SpO2: 98%   General: Awake, oriented x4. CV:  Good peripheral perfusion. Resp:  Normal effort. Abd:  No distention. Other:  Elderly well-developed, well-nourished Caucasian female resting comfortably in no acute distress.  2 cm horizontal linear laceration to the right temporal region that is hemostatic ED Results / Procedures / Treatments  Labs (all labs ordered are listed, but only abnormal results are displayed) Labs Reviewed - No data to display RADIOLOGY ED MD interpretation: CT of the head without contrast interpreted by me shows no evidence  of acute abnormalities including no intracerebral hemorrhage, obvious masses, or significant edema CT of the cervical spine interpreted by me does not show any evidence of acute abnormalities including no acute fracture, malalignment, height loss, or dislocation - All radiology independently interpreted and agree with radiology assessment Official radiology report(s): CT Cervical Spine Wo Contrast Result Date: 05/02/2024 EXAM: CT CERVICAL SPINE WITHOUT CONTRAST 05/02/2024 10:00:30 AM TECHNIQUE: CT of the cervical spine was performed without the administration of intravenous contrast. Multiplanar reformatted images are provided for review. Automated exposure control, iterative reconstruction, and/or weight based adjustment of the mA/kV was utilized to reduce the radiation dose to as low as reasonably achievable. COMPARISON: 05/25/2023 CLINICAL HISTORY: Neck trauma (Age >= 65y) FINDINGS: BONES AND ALIGNMENT: No acute fracture or traumatic malalignment. DEGENERATIVE CHANGES: No substantial change in multilevel degenerative spondylosis and facet arthropathy compared to 05/25/2023. Atlantoccipital fusion. No severe spinal canal narrowing. SOFT TISSUES: No prevertebral soft tissue swelling. IMPRESSION: 1. No evidence of acute traumatic injury. Electronically signed by: Norman Gatlin MD 05/02/2024 10:17 AM EST RP Workstation: [id number]   CT Head Wo Contrast Result Date: 05/02/2024 EXAM: CT HEAD WITHOUT CONTRAST 05/02/2024 10:00:30 AM TECHNIQUE: CT of the head was performed without the administration of  intravenous contrast. Automated exposure control, iterative reconstruction, and/or weight based adjustment of the mA/kV was utilized to reduce the radiation dose to as low as reasonably achievable. COMPARISON: 12/23/2023 CLINICAL HISTORY: Head trauma, minor (Age >= 65y) FINDINGS: BRAIN AND VENTRICLES: No acute hemorrhage. No evidence of acute infarct. No hydrocephalus. No extra-axial collection. No mass effect or  midline shift. Stable atrophy and chronic small vessel ischemia. ORBITS: No acute abnormality. SINUSES: Mucosal thickening throughout paranasal sinuses with fluid levels in sphenoid and maxillary sinuses. Left frontal sinus osteoma. SOFT TISSUES AND SKULL: No acute soft tissue abnormality. No skull fracture. Contusion and subcutaneous emphysema about the right forehead. Atherosclerosis of skullbase vasculature without hyperdense vessel or abnormal calcification. IMPRESSION: 1. No acute intracranial abnormality. 2. Laceration / contusion about the right forehead. 3. Paranasal sinus mucosal thickening with fluid levels in the sphenoid and maxillary sinuses. Correlate for acute sinusitis. Electronically signed by: Norman Gatlin MD 05/02/2024 10:14 AM EST RP Workstation: [id number]   DG Hand Complete Right Result Date: 05/02/2024 EXAM: 3 OR MORE VIEW(S) XRAY OF THE RIGHT HAND 05/02/2024 09:49:15 AM COMPARISON: None available. CLINICAL HISTORY: fall w/ pain over 5th MCP and little finger FINDINGS: BONES AND JOINTS: No acute fracture. No malalignment. Advanced degenerative changes at the first carpometacarpal joint with joint space narrowing and osteophyte formation. SOFT TISSUES: The soft tissues are unremarkable. IMPRESSION: 1. No acute fracture or dislocation. 2. 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 Electronically signed by: Norman Gatlin MD 05/02/2024 10:00 AM EST RP Workstation: [id number]   PROCEDURES: Critical Care performed: No .Laceration Repair  Date/Time: 05/02/2024 11:03 AM  Performed by: Jossie Artist POUR, MD Authorized by: Jossie Artist POUR, MD   Consent:    Consent obtained:  Verbal   Consent given by:  Patient   Risks, benefits, and alternatives were discussed: yes     Risks discussed:  Infection, pain, retained foreign body, need for additional repair, poor cosmetic result, tendon damage, vascular damage, poor wound healing and nerve damage   Alternatives discussed:  No treatment, delayed  treatment, observation and referral Universal protocol:    Immediately prior to procedure, a time out was called: yes     Patient identity confirmed:  Verbally with patient Anesthesia:    Anesthesia method:  None Laceration details:    Location:  Face   Length (cm):  2   Depth (mm):  5 Pre-procedure details:    Preparation:  Patient was prepped and draped in usual sterile fashion Exploration:    Wound exploration: entire depth of wound visualized     Contaminated: no   Treatment:    Area cleansed with:  Povidone-iodine and saline   Amount of cleaning:  Standard   Irrigation solution:  Sterile saline   Irrigation method:  Syringe Skin repair:    Repair method:  Steri-Strips and tissue adhesive   Number of Steri-Strips:  2 Approximation:    Approximation:  Close Repair type:    Repair type:  Simple Post-procedure details:    Dressing:  Antibiotic ointment and non-adherent dressing   Procedure completion:  Tolerated well, no immediate complications  MEDICATIONS ORDERED IN ED: Medications  Tdap (ADACEL ) injection 0.5 mL (0.5 mLs Intramuscular Given 05/02/24 0922)   IMPRESSION / MDM / ASSESSMENT AND PLAN / ED COURSE  I reviewed the triage vital signs and the nursing notes.  The patient is on the cardiac monitor to evaluate for evidence of arrhythmia and/or significant heart rate changes. Patient's presentation is most consistent with acute presentation with potential threat to life or bodily function. Patient is a 89 year old female with the above-stated past medical history who presents after a fall at a long-term care facility just prior to arrival DDx: Intracranial hemorrhage, scalp fracture, spinal fracture, spinal cord impingement Plan: CT head and neck, lac repair  Radiologic evaluation and physical exam is not show any evidence of acute abnormalities other than a facial laceration which was repaired at bedside.  Patient had a superficial skin  tear to the right forearm that was cleansed and did not need repair.  Patient given wound care instructions as well as sent in AVS for patient's facility.  Patient's daughter at bedside through this laceration repair as well as follow-up with the patient.  Patient's daughter agrees with plan for discharge at this time with outpatient PCP follow-up as needed.  Given strict return precautions and all questions were answered prior to discharge  Dispo: Discharge home with PCP follow-up   FINAL CLINICAL IMPRESSION(S) / ED DIAGNOSES   Final diagnoses:  Laceration of scalp, initial encounter  Fall, initial encounter  Injury of head, initial encounter   Rx / DC Orders   ED Discharge Orders     None      Note:  This document was prepared using Dragon voice recognition software and may include unintentional dictation errors.   Benjermin Korber K, MD 05/02/24 1108  "

## 2024-05-02 NOTE — ED Notes (Signed)
 Report to [facility], [facility]

## 2024-05-02 NOTE — ED Notes (Signed)
 [facility] called at 11:39 talked with Octaviano sts she is the #rd in line

## 2024-05-02 NOTE — ED Triage Notes (Signed)
 Pt to ED [facility] from [facility] for unwitnessed fall. Dementia at baseline. Hit right side of head. Skin tear to right forearm. NAD noted. No blood thinner use.
